# Patient Record
Sex: Male | Born: 1995 | Race: Black or African American | Hispanic: No | Marital: Single | Smoking: Never smoker
Health system: Southern US, Community
[De-identification: ages and names within clinical notes are randomized; demographics above are authoritative.]

## PROBLEM LIST (undated history)

## (undated) HISTORY — PX: HAND SURGERY: SHX662

---
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
## 2013-02-22 IMAGING — CR DG WRIST COMPLETE 3+V*R*
4 series · 4 of 4 positions shown · non-contrast
Comparison: None.

CLINICAL DATA: Lateral wrist pain, no trauma

RIGHT WRIST - COMPLETE 3+ VIEW

[x wrist pa right]
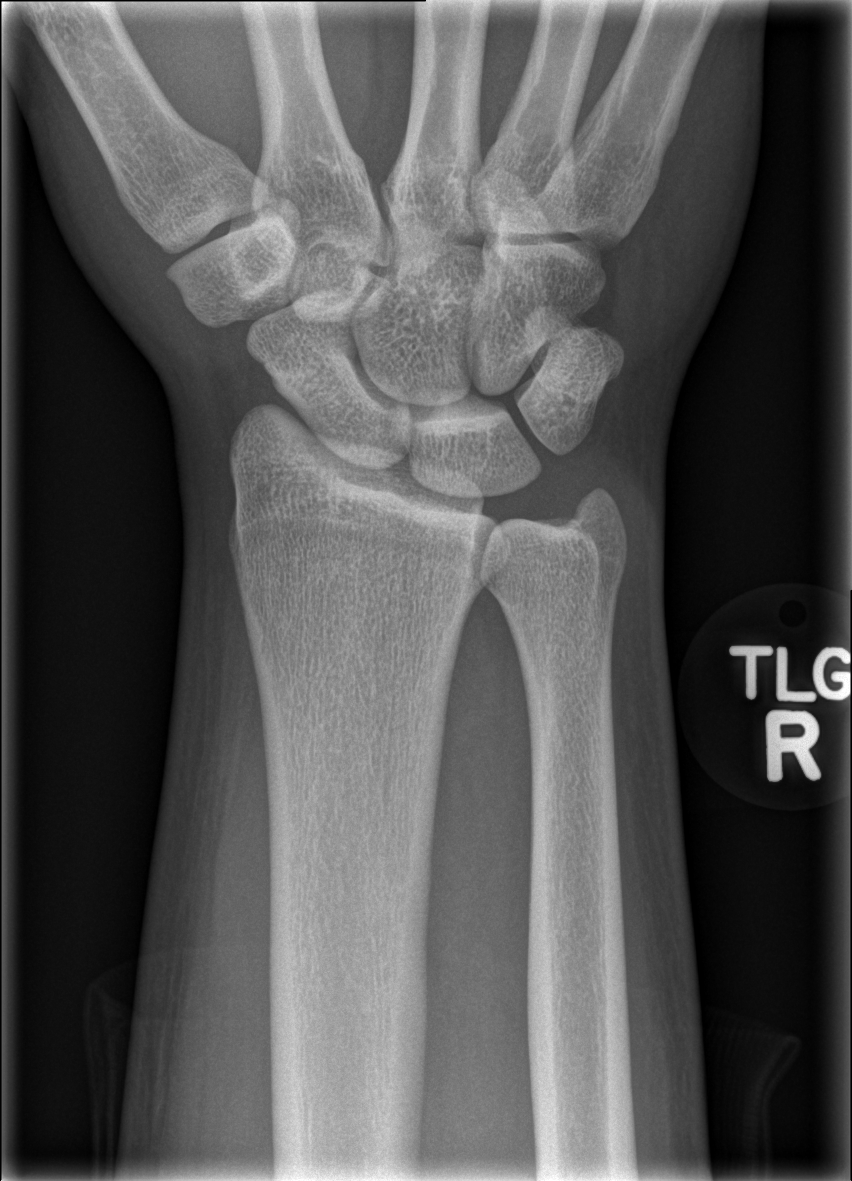

[x wrist obl right]
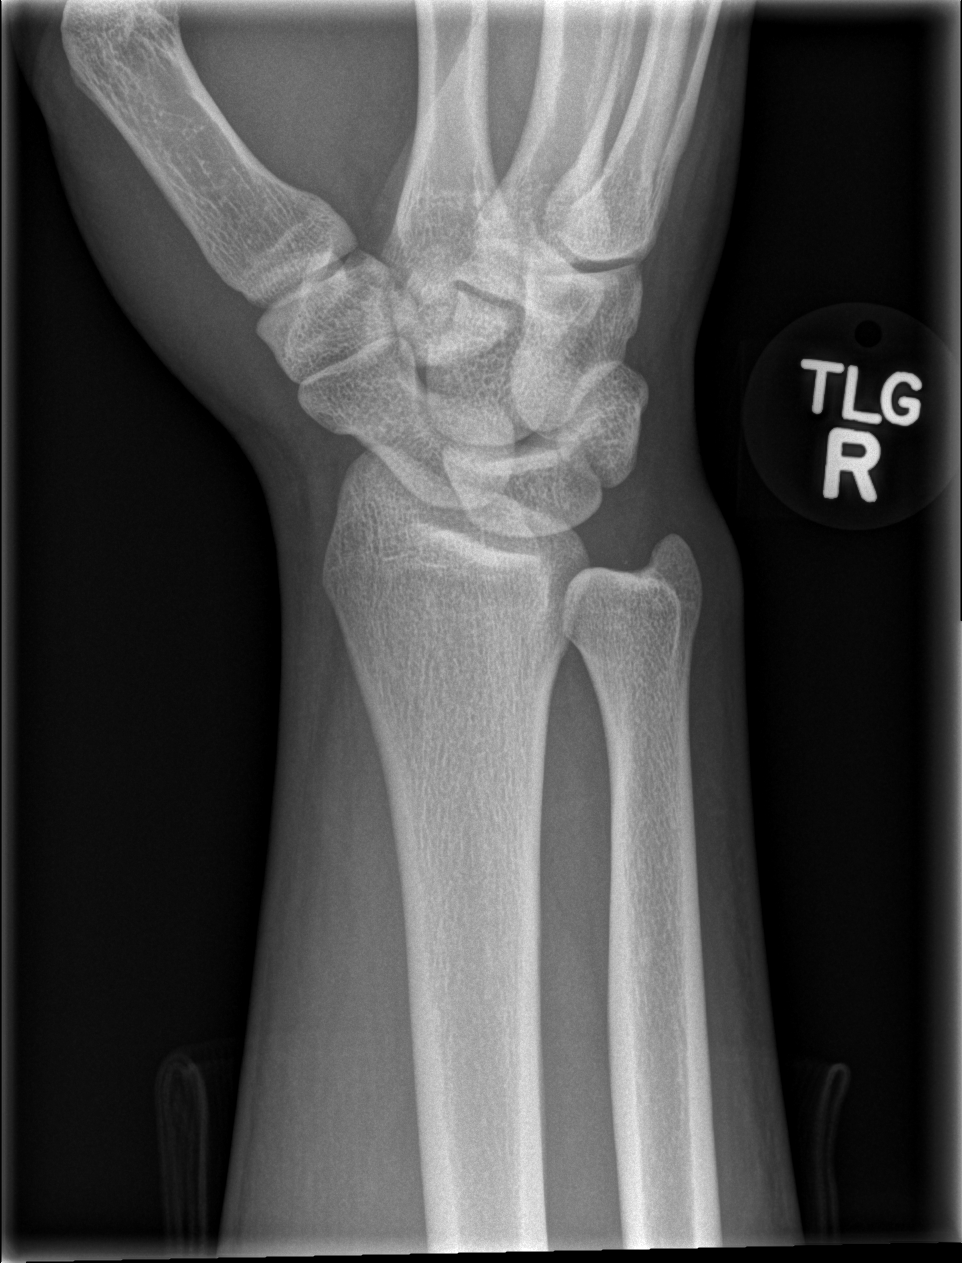

[x wrist lat right]
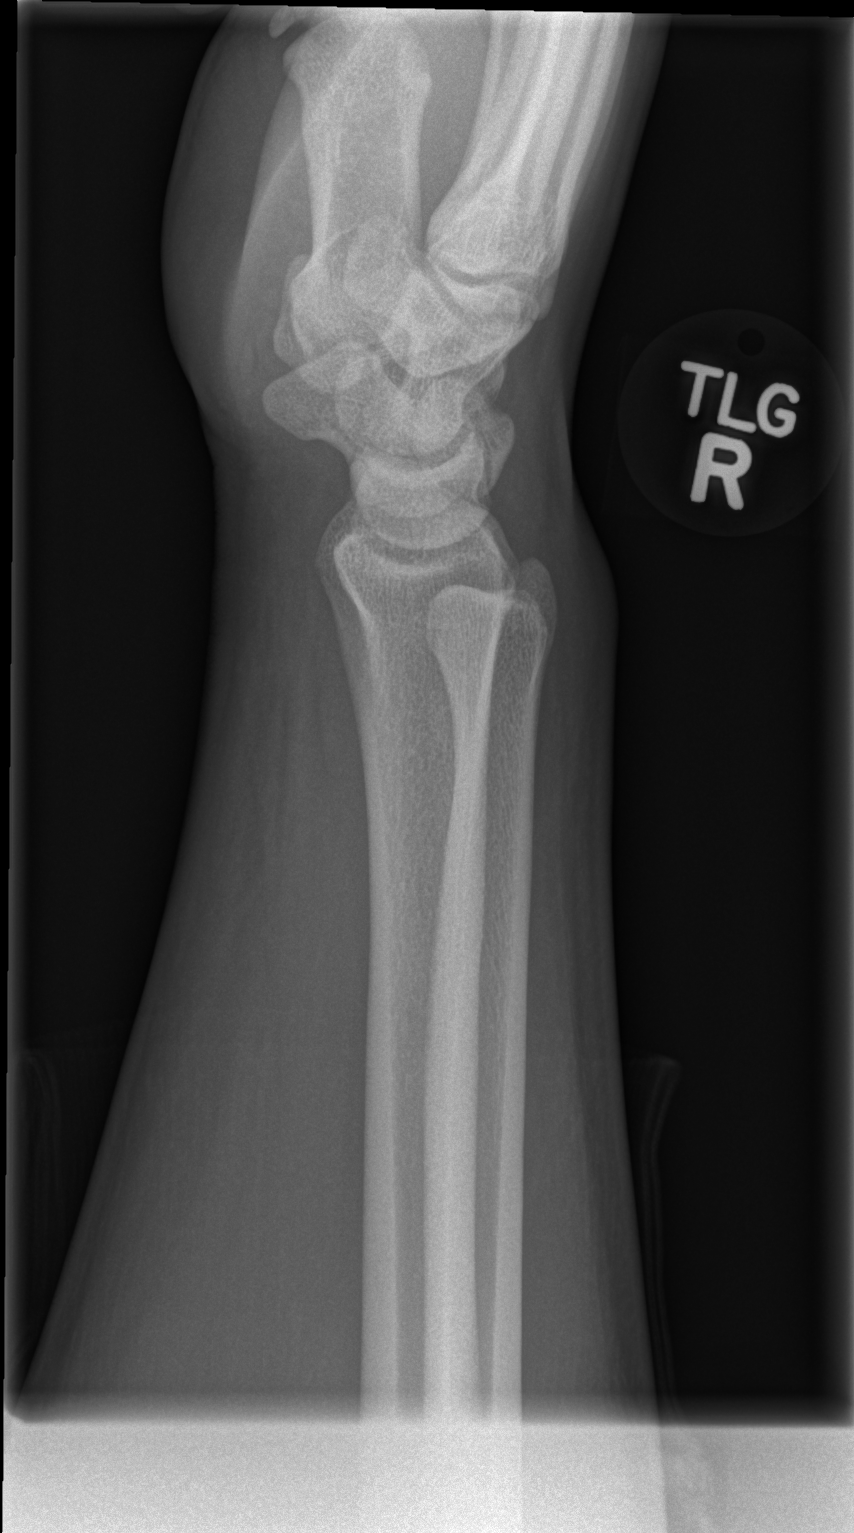

[x wrist navicular view right]
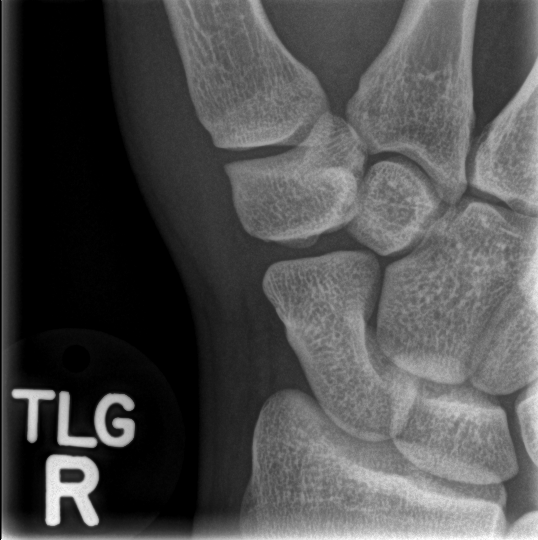

[4 of 4 positions shown; findings below may reference images not displayed]

FINDINGS: No fracture or dislocation.  No soft tissue abnormality.
No radiopaque foreign body.
IMPRESSION: Normal exam.

## 2015-01-12 ENCOUNTER — Emergency Department (HOSPITAL_COMMUNITY): Payer: Worker's Compensation

## 2015-01-12 ENCOUNTER — Encounter (HOSPITAL_COMMUNITY): Payer: Self-pay | Admitting: Emergency Medicine

## 2015-01-12 ENCOUNTER — Emergency Department (HOSPITAL_COMMUNITY)
Admission: EM | Admit: 2015-01-12 | Discharge: 2015-01-12 | Disposition: A | Discharge status: Home / Self Care | Payer: Worker's Compensation | Attending: Emergency Medicine | Admitting: Emergency Medicine

## 2015-01-12 DIAGNOSIS — W208XXA Other cause of strike by thrown, projected or falling object, initial encounter: Secondary | ICD-10-CM | POA: Diagnosis not present

## 2015-01-12 DIAGNOSIS — Y9289 Other specified places as the place of occurrence of the external cause: Secondary | ICD-10-CM | POA: Insufficient documentation

## 2015-01-12 DIAGNOSIS — Y9389 Activity, other specified: Secondary | ICD-10-CM | POA: Insufficient documentation

## 2015-01-12 DIAGNOSIS — S9032XA Contusion of left foot, initial encounter: Secondary | ICD-10-CM | POA: Insufficient documentation

## 2015-01-12 DIAGNOSIS — Y990 Civilian activity done for income or pay: Secondary | ICD-10-CM | POA: Insufficient documentation

## 2015-01-12 DIAGNOSIS — S99922A Unspecified injury of left foot, initial encounter: Secondary | ICD-10-CM | POA: Diagnosis present

## 2015-01-12 MED ORDER — NAPROXEN 500 MG PO TABS: 500.0000 mg | ORAL_TABLET | Freq: Two times a day (BID) | ORAL | Status: AC

## 2015-01-12 NOTE — ED Notes (Signed)
Was at work yesterday when heavy box fell on left foot. States it fell above the steel toe boot and hit in the middle of his foot. States the pain and swelling increased over night until he felt like he couldn't walk on it this morning. Pulse, sensation, movement intact in toes, hurts to walk on foot, moderate swelling to anterior top of left foot.

## 2015-01-12 NOTE — ED Provider Notes (Signed)
CSN: [id number]     Arrival date & time 01/12/15  1558 History   By signing my name below, I, Philip Fields, attest that this documentation has been prepared under the direction and in the presence of non-physician practitioner, Philip Crumble, PA-C. Electronically Signed: Freida Fields, Scribe. 01/12/2015. 5:03 PM.  Chief Complaint  Patient presents with  . Foot Injury   The history is provided by the patient. No language interpreter was used.    HPI Comments:  Philip Fields is a 19 y.o. male who presents to the Emergency Department complaining of constant pain and swelling to his left foot following injury yesterday. He states his pain worsened this am. Pt dropped a 70-80 pound package on the foot while at work yesterday. His pain is exacerbated when bearing weight on the extremity. No alleviating factors or associated symptoms noted.  No past medical history on file. Past Surgical History  Procedure Laterality Date  . Hand surgery Left    No family history on file. Social History  Substance Use Topics  . Smoking status: Not on file  . Smokeless tobacco: Not on file  . Alcohol Use: No    Review of Systems  Constitutional: Negative for fever and chills.  Musculoskeletal: Positive for myalgias and arthralgias.   Allergies  Review of patient's allergies indicates no known allergies.  Home Medications   Prior to Admission medications   Medication Sig Start Date End Date Taking? Authorizing Provider  ibuprofen (ADVIL,MOTRIN) 200 MG tablet Take 400-600 mg by mouth every 6 (six) hours as needed. For pain    Historical Provider, MD   BP 120/74 mmHg  Pulse 73  Temp(Src) 98.2 F (36.8 C) (Oral)  Resp 18  SpO2 100% Physical Exam  Constitutional: He is oriented to person, place, and time. He appears well-developed and well-nourished. No distress.  HENT:  Head: Normocephalic and atraumatic.  Eyes: Conjunctivae are normal.  Cardiovascular: Normal rate.   Pulmonary/Chest:  Effort normal.  Abdominal: He exhibits no distension.  Musculoskeletal:  Mild swelling noted to the dorsal left foot. Tender to palpation of fourth, third, second metatarsals. Pain with range of motion of the third, fourth toes and MTP joints. Dorsal pedal pulses intact. Normal ankle with no tenderness over bilateral malleoli. Full range of motion of the ankle joint. Toes are pink, warm, less than 2 second cap refill.    Neurological: He is alert and oriented to person, place, and time.  Skin: Skin is warm and dry. He is not diaphoretic.  Psychiatric: He has a normal mood and affect.  Nursing note and vitals reviewed.   ED Course  Procedures   DIAGNOSTIC STUDIES:  Oxygen Saturation is 100% on RA, normal by my interpretation.    COORDINATION OF CARE:  5:00 PM Discussed treatment plan with pt at bedside and pt agreed to plan.  Labs Review Labs Reviewed - No data to display  Imaging Review Dg Foot Complete Left  01/12/2015   CLINICAL DATA:  Box fell on foot with subsequent pain, initial encounter  EXAM: LEFT FOOT - COMPLETE 3+ VIEW  COMPARISON:  None.  FINDINGS: There is no evidence of fracture or dislocation. There is no evidence of arthropathy or other focal bone abnormality. Soft tissues are unremarkable.  IMPRESSION: No acute abnormality noted   Electronically Signed   By: Philip Fields M.D.   On: 01/12/2015 16:33   I have personally reviewed and evaluated these images as part of my medical decision-making.   EKG Interpretation  None      MDM   Final diagnoses:  Foot contusion, left, initial encounter    The patient is here after a heavy box fell on his left foot yesterday, now having pain and swelling. X-rays negative. No evidence of compartment syndrome. Plan to discharge home with naproxen, ice, elevation, crutches, follow-up as needed.  Filed Vitals:   01/12/15 1605  BP: 120/74  Pulse: 73  Temp: 98.2 F (36.8 C)  TempSrc: Oral  Resp: 18  SpO2: 100%    I  personally performed the services described in this documentation, which was scribed in my presence. The recorded information has been reviewed and is accurate.   Philip Crumble, PA-C 01/12/15 1853  Philip Baptist, MD 01/14/15 1626

## 2015-01-12 NOTE — Discharge Instructions (Signed)
Naprosyn for pain and inflammation. Ice. Elevate. Follow up with primary care doctor.   Foot Contusion A foot contusion is a deep bruise to the foot. Contusions are the result of an injury that caused bleeding under the skin. The contusion may turn blue, purple, or yellow. Minor injuries will give you a painless contusion, but more severe contusions may stay painful and swollen for a few weeks. CAUSES  A foot contusion comes from a direct blow to that area, such as a heavy object falling on the foot. SYMPTOMS   Swelling of the foot.  Discoloration of the foot.  Tenderness or soreness of the foot. DIAGNOSIS  You will have a physical exam and will be asked about your history. You may need an X-ray of your foot to look for a broken bone (fracture).  TREATMENT  An elastic wrap may be recommended to support your foot. Resting, elevating, and applying cold compresses to your foot are often the best treatments for a foot contusion. Over-the-counter medicines may also be recommended for pain control. HOME CARE INSTRUCTIONS   Put ice on the injured area.  Put ice in a plastic bag.  Place a towel between your skin and the bag.  Leave the ice on for 15-20 minutes, 03-04 times a day.  Only take over-the-counter or prescription medicines for pain, discomfort, or fever as directed by your caregiver.  If told, use an elastic wrap as directed. This can help reduce swelling. You may remove the wrap for sleeping, showering, and bathing. If your toes become numb, cold, or blue, take the wrap off and reapply it more loosely.  Elevate your foot with pillows to reduce swelling.  Try to avoid standing or walking while the foot is painful. Do not resume use until instructed by your caregiver. Then, begin use gradually. If pain develops, decrease use. Gradually increase activities that do not cause discomfort until you have normal use of your foot.  See your caregiver as directed. It is very important to  keep all follow-up appointments in order to avoid any lasting problems with your foot, including long-term (chronic) pain. SEEK IMMEDIATE MEDICAL CARE IF:   You have increased redness, swelling, or pain in your foot.  Your swelling or pain is not relieved with medicines.  You have loss of feeling in your foot or are unable to move your toes.  Your foot turns cold or blue.  You have pain when you move your toes.  Your foot becomes warm to the touch.  Your contusion does not improve in 2 days. MAKE SURE YOU:   Understand these instructions.  Will watch your condition.  Will get help right away if you are not doing well or get worse. Document Released: 01/21/2006 Document Revised: 10/01/2011 Document Reviewed: 03/05/2011 [facility] Patient Information 2015 [place], [place]. This information is not intended to replace advice given to you by your health care provider. Make sure you discuss any questions you have with your health care provider.

## 2018-10-17 ENCOUNTER — Other Ambulatory Visit: Payer: Self-pay

## 2018-10-17 ENCOUNTER — Emergency Department (HOSPITAL_COMMUNITY)
Admission: EM | Admit: 2018-10-17 | Discharge: 2018-10-17 | Disposition: A | Discharge status: Home / Self Care | Payer: Self-pay | Attending: Emergency Medicine | Admitting: Emergency Medicine

## 2018-10-17 ENCOUNTER — Encounter (HOSPITAL_COMMUNITY): Payer: Self-pay

## 2018-10-17 DIAGNOSIS — Y999 Unspecified external cause status: Secondary | ICD-10-CM | POA: Insufficient documentation

## 2018-10-17 DIAGNOSIS — Z79899 Other long term (current) drug therapy: Secondary | ICD-10-CM | POA: Insufficient documentation

## 2018-10-17 DIAGNOSIS — Y939 Activity, unspecified: Secondary | ICD-10-CM | POA: Insufficient documentation

## 2018-10-17 DIAGNOSIS — S00531A Contusion of lip, initial encounter: Secondary | ICD-10-CM | POA: Insufficient documentation

## 2018-10-17 DIAGNOSIS — W208XXA Other cause of strike by thrown, projected or falling object, initial encounter: Secondary | ICD-10-CM | POA: Insufficient documentation

## 2018-10-17 DIAGNOSIS — Y9289 Other specified places as the place of occurrence of the external cause: Secondary | ICD-10-CM | POA: Insufficient documentation

## 2018-10-17 DIAGNOSIS — S0083XA Contusion of other part of head, initial encounter: Secondary | ICD-10-CM | POA: Insufficient documentation

## 2018-10-17 DIAGNOSIS — Z23 Encounter for immunization: Secondary | ICD-10-CM | POA: Insufficient documentation

## 2018-10-17 MED ORDER — TETANUS-DIPHTH-ACELL PERTUSSIS 5-2.5-18.5 LF-MCG/0.5 IM SUSP
0.5000 mL | Freq: Once | INTRAMUSCULAR | Status: AC
  Administered 2018-10-17: 0.5 mL via INTRAMUSCULAR
  Filled 2018-10-17: qty 0.5

## 2018-10-17 MED ORDER — BACITRACIN ZINC 500 UNIT/GM EX OINT
TOPICAL_OINTMENT | Freq: Two times a day (BID) | CUTANEOUS | Status: DC
  Administered 2018-10-17: 1 via TOPICAL
  Filled 2018-10-17 (×2): qty 0.9

## 2018-10-17 MED ORDER — ACETAMINOPHEN 325 MG PO TABS
650.0000 mg | ORAL_TABLET | Freq: Once | ORAL | Status: AC
  Administered 2018-10-17: 650 mg via ORAL
  Filled 2018-10-17: qty 2

## 2018-10-17 NOTE — Discharge Instructions (Addendum)
Please read instructions below. You can treat your headache with over-the-counter medications such as tylenol as needed. Keep your wounds clean. Apply ice to your lip for 20 minutes at a time to help with swelling. It is possible you have a minor concussion: Stay hydrated and get plenty of rest. Limit your screen time and complex thinking. Avoid any contact sports/activities to prevent re-injury to your head. Follow up with your primary care provider in 1 week for re-check and to be cleared to return to normal activity. Return to the ER if you develop severely worsening headache, changes in your vision, persistent vomiting, or new or concerning symptoms.

## 2018-10-17 NOTE — ED Provider Notes (Signed)
[facility]-EMERGENCY DEPT Provider Note   CSN: [id number] Arrival date & time: 10/17/18  1055    History   Chief Complaint Chief Complaint  Patient presents with  . Facial Laceration    HPI Philip Fields is a 23 y.o. male without significant past medical history, presenting to the emergency department via EMS after injury that occurred at work prior to arrival.  There is reported that a metal cart struck him in the face.  He states this caused him to fall and he scraped his face on the ground.  He did not have loss of consciousness.  States he does not recall the exact details of the event, however he is oriented to person place and time on evaluation.  He denies headache, vision changes, or other injuries.  His teeth are not loose or painful.  No other injuries reported.  He is unsure of his last tetanus immunization.  He does not take blood thinners.     The history is provided by the patient.    History reviewed. No pertinent past medical history.  There are no active problems to display for this patient.   Past Surgical History:  Procedure Laterality Date  . HAND SURGERY Left         Home Medications    Prior to Admission medications   Medication Sig Start Date End Date Taking? Authorizing Provider  ibuprofen (ADVIL,MOTRIN) 200 MG tablet Take 400-600 mg by mouth every 6 (six) hours as needed. For pain    [provider]  naproxen (NAPROSYN) 500 MG tablet Take 1 tablet (500 mg total) by mouth 2 (two) times daily. 01/12/15   Jaynie CrumbleKirichenko, Tatyana, PA-C    Family History History reviewed. No pertinent family history.  Social History Social History   Tobacco Use  . Smoking status: Never Smoker  . Smokeless tobacco: Never Used  Substance Use Topics  . Alcohol use: No  . Drug use: No     Allergies   Patient has no known allergies.   Review of Systems Review of Systems  All other systems reviewed and are negative.    Physical  Exam Updated Vital Signs BP 127/79   Pulse 68   Temp 98 F (36.7 C) (Oral)   Resp 16   Ht 6\' 5"  (1.956 m)   Wt 97.5 kg   SpO2 99%   BMI 25.50 kg/m   Physical Exam Vitals signs and nursing note reviewed.  Constitutional:      Appearance: He is well-developed.  HENT:     Head: Normocephalic and atraumatic.     Comments: Superficial abrasion to right upper lip, below nose. Right upper lip with bruising and contusion, superficial laceration to oral mucosa of the lip. Teeth are not tender or loose.  Old healed scars between eyebrows Eyes:     Extraocular Movements: Extraocular movements intact.     Conjunctiva/sclera: Conjunctivae normal.     Pupils: Pupils are equal, round, and reactive to light.  Neck:     Musculoskeletal: Normal range of motion and neck supple. No muscular tenderness.  Cardiovascular:     Rate and Rhythm: Normal rate and regular rhythm.  Pulmonary:     Effort: Pulmonary effort is normal. No respiratory distress.     Breath sounds: Normal breath sounds.  Abdominal:     Palpations: Abdomen is soft.  Musculoskeletal: Normal range of motion.  Skin:    General: Skin is warm.  Neurological:     Mental Status:  He is alert and oriented to person, place, and time.     Cranial Nerves: No cranial nerve deficit.     Comments: Normal coordination with finger-to-nose.  Normal strength in bilateral upper and lower extremities.  Psychiatric:        Mood and Affect: Mood normal.        Behavior: Behavior normal.      ED Treatments / Results  Labs (all labs ordered are listed, but only abnormal results are displayed) Labs Reviewed - No data to display  EKG None  Radiology No results found.  Procedures Procedures (including critical care time)  Medications Ordered in ED Medications  Tdap (BOOSTRIX) injection 0.5 mL (has no administration in time range)  acetaminophen (TYLENOL) tablet 650 mg (has no administration in time range)  bacitracin ointment (has  no administration in time range)     Initial Impression / Assessment and Plan / ED Course  I have reviewed the triage vital signs and the nursing notes.  Pertinent labs & imaging results that were available during my care of the patient were reviewed by me and considered in my medical decision making (see chart for details).        Pt with superficial abrasion and contusion to right upper lip after a metal cart struck him in the face. He states this caused him to fall and he scraped his face on the ground.  States the back events of the injury are difficult to remember, however he did not have any LOC.  He has no other complaints today other than the superficial wounds to his right lip.  He is alert and oriented with no focal neuro deficits. May have mild concussion with some difficulty recalling exact events of the injury, however do not believe CT is indicated at this time per [place] head CT criteria. Wounds irrigated. Tdap updated. Will discharge with concussion precautions and strict return precautions. Safe for discharge.  Discussed results, findings, treatment and follow up. Patient advised of return precautions. Patient verbalized understanding and agreed with plan.  Final Clinical Impressions(s) / ED Diagnoses   Final diagnoses:  Facial contusion, initial encounter    ED Discharge Orders    None       Kimberlly Norgard, [place] N, PA-C 10/17/18 1256    Maudie Flakes, MD 10/21/18 1022

## 2018-10-17 NOTE — ED Triage Notes (Signed)
EMS reports steel cart struck right side of face at work. Superficial lacerations to right forehead and upper lip.  BP 128/90 HR 87 RR 18 SP02 97 RA CBG 150

## 2018-10-17 NOTE — ED Notes (Signed)
Bed: WA10 Expected date: 10/17/18 Expected time: 11:00 AM Means of arrival: Ambulance Comments: Facial injury
